# Patient Record
Sex: Female | Born: 1995 | Race: White | Hispanic: Yes | Marital: Single | Smoking: Never smoker
Health system: Southern US, Community
[De-identification: ages and names within clinical notes are randomized; demographics above are authoritative.]

## PROBLEM LIST (undated history)

## (undated) DIAGNOSIS — J189 Pneumonia, unspecified organism: Secondary | ICD-10-CM

## (undated) HISTORY — DX: Pneumonia, unspecified organism: J18.9

---
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
## 2017-01-25 ENCOUNTER — Ambulatory Visit (INDEPENDENT_AMBULATORY_CARE_PROVIDER_SITE_OTHER): Payer: BLUE CROSS/BLUE SHIELD

## 2017-01-25 DIAGNOSIS — Z113 Encounter for screening for infections with a predominantly sexual mode of transmission: Secondary | ICD-10-CM

## 2017-01-25 DIAGNOSIS — Z3042 Encounter for surveillance of injectable contraceptive: Secondary | ICD-10-CM

## 2017-01-25 MED ORDER — MEDROXYPROGESTERONE ACETATE 150 MG/ML IM SUSP
150.0000 mg | Freq: Once | INTRAMUSCULAR | Status: AC
  Administered 2017-01-25: 150 mg via INTRAMUSCULAR

## 2017-01-25 MED ORDER — MEDROXYPROGESTERONE ACETATE 150 MG/ML IM SUSP: 150.0000 mg | Freq: Once | INTRAMUSCULAR | Status: DC

## 2017-01-25 NOTE — Progress Notes (Signed)
Pt presents for depo injection. Pt within depo window, no urine hcg needed. Injection given, tolerated well. F/u depo injection visit scheduled. Also obtained urine specimen for yearly STI check.   Confidential number: [phone]

## 2017-01-26 LAB — C. TRACHOMATIS/N. GONORRHOEAE RNA
C. trachomatis RNA, TMA: NOT DETECTED
N. GONORRHOEAE RNA, TMA: NOT DETECTED

## 2017-03-17 IMAGING — CR DG FINGER THUMB 2+V*L*
3 series · 3 of 3 positions shown · non-contrast
Comparison: None.

CLINICAL DATA: 20-year-old female slammed left thumb in car door.
Distal pain and bruising. Initial encounter.

EXAM:
LEFT THUMB 2+V

[finger ap]
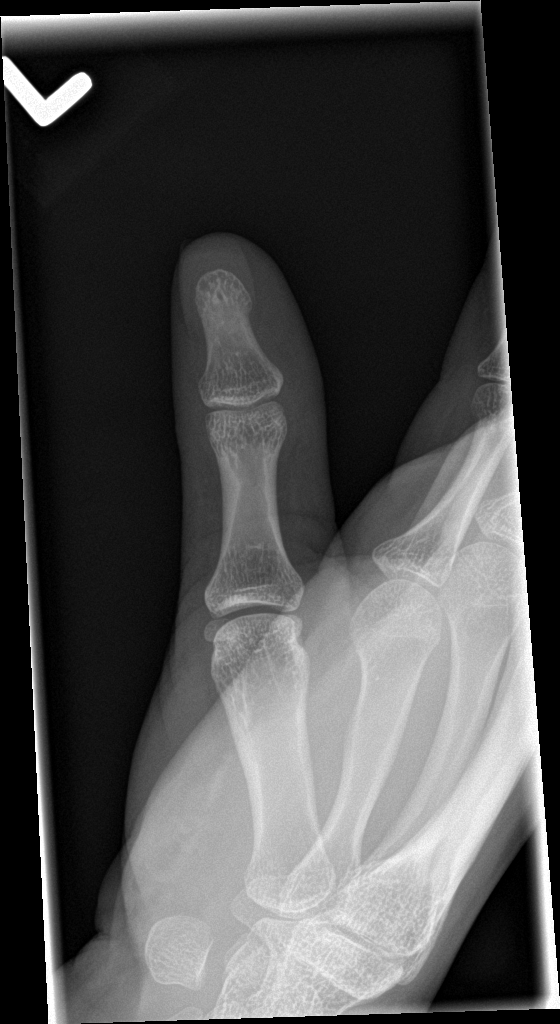

[finger obl]
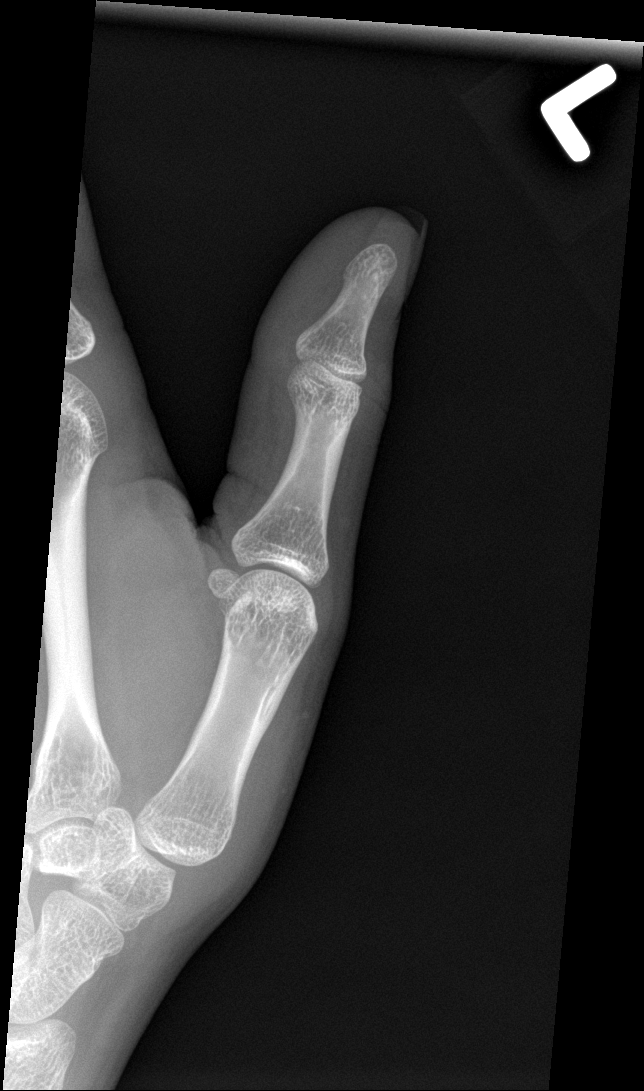

[finger lat]
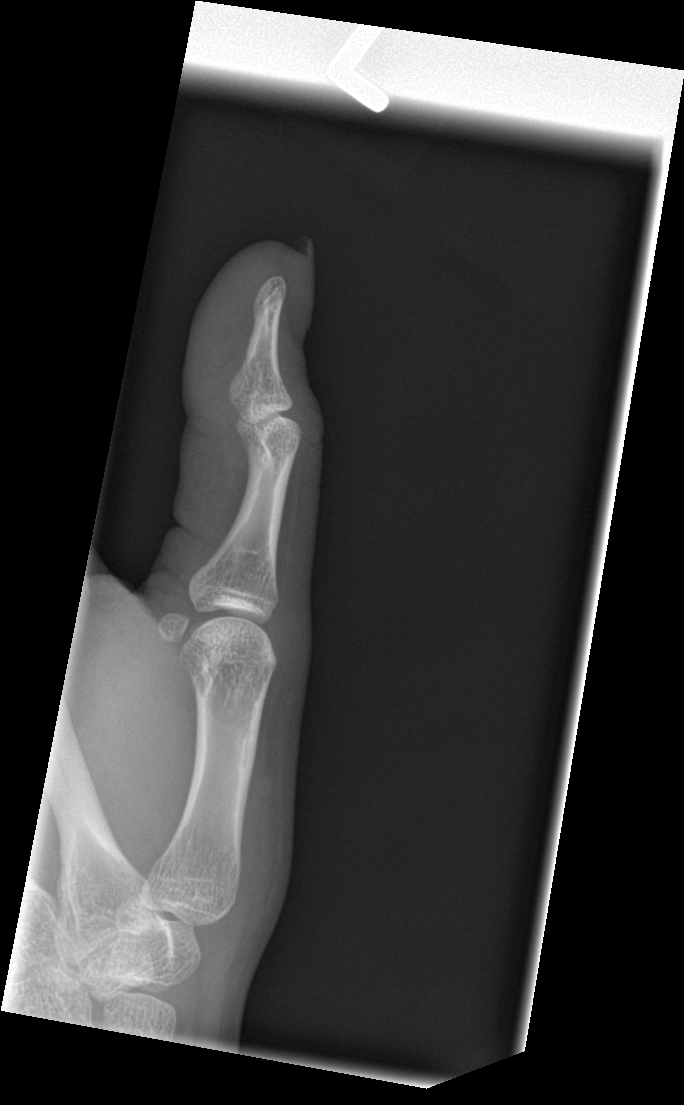

[3 of 3 positions shown; findings below may reference images not displayed]

FINDINGS: Bone mineralization is within normal limits. The visible left hand
appears skeletally mature. The left thumb distal phalanx is intact.
Joint spaces and alignment preserved. No acute osseous abnormality
identified. There does appear to be distal thumb soft tissue
swelling.
IMPRESSION: No acute fracture or dislocation identified about the left thumb.

## 2017-04-12 ENCOUNTER — Ambulatory Visit (INDEPENDENT_AMBULATORY_CARE_PROVIDER_SITE_OTHER): Payer: BLUE CROSS/BLUE SHIELD

## 2017-04-12 DIAGNOSIS — Z3042 Encounter for surveillance of injectable contraceptive: Secondary | ICD-10-CM

## 2017-04-12 MED ORDER — MEDROXYPROGESTERONE ACETATE 150 MG/ML IM SUSP
150.0000 mg | Freq: Once | INTRAMUSCULAR | Status: AC
Start: 2017-04-12 — End: 2017-04-12
  Administered 2017-04-12: 150 mg via INTRAMUSCULAR

## 2017-04-12 NOTE — Progress Notes (Signed)
Pt presents for depo injection. Pt within depo window, no urine hcg needed. Injection given, tolerated well. F/u depo injection visit scheduled.   

## 2017-04-14 ENCOUNTER — Encounter (HOSPITAL_COMMUNITY): Payer: Self-pay | Admitting: Emergency Medicine

## 2017-04-14 ENCOUNTER — Other Ambulatory Visit: Payer: Self-pay

## 2017-04-14 ENCOUNTER — Ambulatory Visit (HOSPITAL_COMMUNITY)
Admission: EM | Admit: 2017-04-14 | Discharge: 2017-04-14 | Disposition: A | Discharge status: Home / Self Care | Payer: Self-pay | Attending: Family Medicine | Admitting: Family Medicine

## 2017-04-14 DIAGNOSIS — M62838 Other muscle spasm: Secondary | ICD-10-CM

## 2017-04-14 DIAGNOSIS — S161XXA Strain of muscle, fascia and tendon at neck level, initial encounter: Secondary | ICD-10-CM

## 2017-04-14 MED ORDER — CYCLOBENZAPRINE HCL 10 MG PO TABS: 10.0000 mg | ORAL_TABLET | Freq: Three times a day (TID) | ORAL | 0 refills | Status: AC

## 2017-04-14 MED ORDER — DICLOFENAC SODIUM 75 MG PO TBEC: 75.0000 mg | DELAYED_RELEASE_TABLET | Freq: Two times a day (BID) | ORAL | 0 refills | Status: AC
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
## 2017-04-14 NOTE — ED Triage Notes (Signed)
Per pt she's here due to neck pain. Per pt it started on Sunday.../or

## 2017-05-02 ENCOUNTER — Ambulatory Visit (INDEPENDENT_AMBULATORY_CARE_PROVIDER_SITE_OTHER): Payer: BLUE CROSS/BLUE SHIELD

## 2017-05-02 ENCOUNTER — Ambulatory Visit (HOSPITAL_COMMUNITY)
Admission: EM | Admit: 2017-05-02 | Discharge: 2017-05-02 | Disposition: A | Discharge status: Home / Self Care | Payer: BLUE CROSS/BLUE SHIELD | Attending: Internal Medicine | Admitting: Internal Medicine

## 2017-05-02 ENCOUNTER — Encounter (HOSPITAL_COMMUNITY): Payer: Self-pay

## 2017-05-02 DIAGNOSIS — S62515A Nondisplaced fracture of proximal phalanx of left thumb, initial encounter for closed fracture: Secondary | ICD-10-CM

## 2017-05-02 NOTE — ED Triage Notes (Signed)
Pt presents today with left thumb pain that occurred last night when she fell on her hand. Has some visible bruising and swelling. Has not tried any OTC medication to help relieve sxs.

## 2017-05-02 NOTE — ED Provider Notes (Signed)
[facility]    CSN: [id number] Arrival date & time: 05/02/17  1413     History   Chief Complaint Chief Complaint  Patient presents with  . Hand Pain    HPI KENLEIGH TOBACK is a 21 y.o. female.   21 year old female, with no significant past medical history, presenting today complaining of left thumb pain.  Patient states that she was on a hover board last night and fell off.  Caught herself with her left hand.  Is unsure exactly how her hand was positioned when she fell.  Since that time, she has had pain, swelling and bruising in her left thumb.  Patient is right-handed. No other injuries or complaints   The history is provided by the patient.  Hand Pain  This is a new problem. The current episode started yesterday. The problem occurs constantly. The problem has not changed since onset.Pertinent negatives include no chest pain, no abdominal pain, no headaches and no shortness of breath. Exacerbated by: use of the left thumb  Nothing relieves the symptoms. She has tried nothing for the symptoms. The treatment provided no relief.    Past Medical History:  Diagnosis Date  . Pneumonia    Hospitalized in 2009    Patient Active Problem List   Diagnosis Date Noted  . Well adult on routine health check 12/17/2015  . Encounter for initial prescription of injectable contraceptive 05/16/2015  . Dysmenorrhea in adolescent 12/12/2014  . Headache 12/12/2014    History reviewed. No pertinent surgical history.  OB History    No data available       Home Medications    Prior to Admission medications   Medication Sig Start Date End Date Taking? Authorizing Provider  Estradiol Cypionate (DEPO-ESTRADIOL IM) Inject into the muscle.   Yes [provider]  cyclobenzaprine (FLEXERIL) 10 MG tablet Take 1 tablet (10 mg total) by mouth 3 (three) times daily. 04/14/17   Mardella Layman, MD  diclofenac (VOLTAREN) 75 MG EC tablet Take 1 tablet (75 mg total) by mouth 2 (two)  times daily. 04/14/17   Mardella Layman, MD  trimethoprim-polymyxin b Joaquim Lai) ophthalmic solution Place 2 drops in affected eye every 4 hours while awake until clear 07/20/16   Gregor Hams, NP    Family History No family history on file.  Social History Social History   Tobacco Use  . Smoking status: Never Smoker  . Smokeless tobacco: Never Used  Substance Use Topics  . Alcohol use: No    Alcohol/week: 0.0 oz  . Drug use: No     Allergies   Penicillins   Review of Systems Review of Systems  Constitutional: Negative for chills and fever.  HENT: Negative for congestion, dental problem, drooling, ear pain, facial swelling, mouth sores and sore throat.   Eyes: Negative for pain and visual disturbance.  Respiratory: Negative for cough and shortness of breath.   Cardiovascular: Negative for chest pain and palpitations.  Gastrointestinal: Negative for abdominal pain and vomiting.  Genitourinary: Negative for dysuria and hematuria.  Musculoskeletal: Positive for joint swelling (left thumb). Negative for arthralgias, back pain and neck pain.  Skin: Negative for color change and rash.  Neurological: Negative for seizures, syncope and headaches.  All other systems reviewed and are negative.    Physical Exam Triage Vital Signs ED Triage Vitals  Enc Vitals Group     BP 05/02/17 1432 120/81     Pulse Rate 05/02/17 1432 79     Resp 05/02/17 1432 16  Temp 05/02/17 1432 98.1 F (36.7 C)     Temp Source 05/02/17 1432 Oral     SpO2 05/02/17 1432 96 %     Weight --      Height --      Head Circumference --      Peak Flow --      Pain Score 05/02/17 1433 2     Pain Loc --      Pain Edu? --      Excl. in GC? --    No data found.  Updated Vital Signs BP 120/81 (BP Location: Right Arm)   Pulse 79   Temp 98.1 F (36.7 C) (Oral)   Resp 16   SpO2 96%   Visual Acuity Right Eye Distance:   Left Eye Distance:   Bilateral Distance:    Right Eye Near:   Left Eye  Near:    Bilateral Near:     Physical Exam  Constitutional: She is oriented to person, place, and time. She appears well-developed and well-nourished. No distress.  HENT:  Head: Normocephalic.  Eyes: EOM are normal. Pupils are equal, round, and reactive to light.  Neck: Normal range of motion.  Cardiovascular: Normal rate.  Pulmonary/Chest: Effort normal and breath sounds normal.  Abdominal: Soft.  Musculoskeletal:       Left hand: She exhibits decreased range of motion, tenderness and swelling.       Hands: Tenderness to palpation of the IP joint of the left thumb as well as the base of the left thumb.  There is a mild amount of soft tissue swelling and ecchymosis noted.  Decreased range of motion secondary to pain.  Good radial pulse and cap refill.  Neurovascularly intact  Neurological: She is alert and oriented to person, place, and time.  Skin: Skin is warm. She is not diaphoretic.  Psychiatric: She has a normal mood and affect.  Nursing note and vitals reviewed.    UC Treatments / Results  Labs (all labs ordered are listed, but only abnormal results are displayed) Labs Reviewed - No data to display  EKG  EKG Interpretation None       Radiology Dg Hand Complete Left  Result Date: 05/02/2017 CLINICAL DATA:  Fall, left hand injury EXAM: LEFT HAND - COMPLETE 3+ VIEW COMPARISON:  None. FINDINGS: There is a fracture at the base of the left thumb proximal phalanx. Fracture noted through the mid and proximal portions of the left thumb proximal phalanx. A small fragment at the left base is mildly displaced. No subluxation or dislocation. IMPRESSION: Left thumb proximal phalangeal fracture. Electronically Signed   By: Charlett NoseKevin  Dover M.D.   On: 05/02/2017 15:23    Procedures Procedures (including critical care time)  Medications Ordered in UC Medications - No data to display   Initial Impression / Assessment and Plan / UC Course  I have reviewed the triage vital signs and  the nursing notes.  Pertinent labs & imaging results that were available during my care of the patient were reviewed by me and considered in my medical decision making (see chart for details).     X-ray shows fracture of the proximal left thumb.  Patient was placed in a thumb spica - referred to ortho  Final Clinical Impressions(s) / UC Diagnoses   Final diagnoses:  Closed nondisplaced fracture of proximal phalanx of left thumb, initial encounter    ED Discharge Orders    None       Controlled Substance Prescriptions [place]  Controlled Substance Registry consulted? Not Applicable   Alecia LemmingBlue, Margrete Delude C, [place]PA-C 05/02/17 1542

## 2017-05-04 ENCOUNTER — Encounter (INDEPENDENT_AMBULATORY_CARE_PROVIDER_SITE_OTHER): Payer: Self-pay | Admitting: Orthopaedic Surgery

## 2017-05-04 ENCOUNTER — Ambulatory Visit (INDEPENDENT_AMBULATORY_CARE_PROVIDER_SITE_OTHER): Payer: BLUE CROSS/BLUE SHIELD | Admitting: Orthopaedic Surgery

## 2017-05-04 DIAGNOSIS — S62515A Nondisplaced fracture of proximal phalanx of left thumb, initial encounter for closed fracture: Secondary | ICD-10-CM | POA: Diagnosis not present

## 2017-05-04 NOTE — Progress Notes (Signed)
Office Visit Note   Patient: Paige PersiaJeni E Alvarado           Date of Birth: December 08, 1995           MRN: [id number] Visit Date: 05/04/2017              Requested by: Jonetta OsgoodBrown, Kirsten, MD [address] [place] [id number] PCP: Jonetta OsgoodBrown, Kirsten, MD   Assessment & Plan: Visit Diagnoses:  1. Closed nondisplaced fracture of proximal phalanx of left thumb, initial encounter     Plan: Impression is nondisplaced left thumb proximal phalanx fracture.  Thumb spica brace at all times.  Nonweightbearing and no use of the left hand at work for 6 weeks.  Follow-up in 4 weeks for recheck.  Will need 2 view x-rays of the left thumb on return.  Follow-Up Instructions: Return in about 4 weeks (around 06/01/2017).   Orders:  No orders of the defined types were placed in this encounter.  No orders of the defined types were placed in this encounter.     Procedures: No procedures performed   Clinical Data: No additional findings.   Subjective: Chief Complaint  Patient presents with  . Left Thumb - Pain    Patient is a 21 year old right-hand-dominant female who comes in with acute left thumb fracture from a mechanical fall 2 days ago.  She follows up today from the urgent care.  She denies any numbness and tingling.  She does endorse swelling and bruising.  She is taking over-the-counter meds with relief.    Review of Systems  Constitutional: Negative.   HENT: Negative.   Eyes: Negative.   Respiratory: Negative.   Cardiovascular: Negative.   Endocrine: Negative.   Musculoskeletal: Negative.   Neurological: Negative.   Hematological: Negative.   Psychiatric/Behavioral: Negative.   All other systems reviewed and are negative.    Objective: Vital Signs: There were no vitals taken for this visit.  Physical Exam  Constitutional: She is oriented to person, place, and time. She appears well-developed and well-nourished.  HENT:  Head: Normocephalic and atraumatic.    Eyes: EOM are normal.  Neck: Neck supple.  Pulmonary/Chest: Effort normal.  Abdominal: Soft.  Neurological: She is alert and oriented to person, place, and time.  Skin: Skin is warm. Capillary refill takes less than 2 seconds.  Psychiatric: She has a normal mood and affect. Her behavior is normal. Judgment and thought content normal.  Nursing note and vitals reviewed.   Ortho Exam Left thumb examined shows moderate swelling and bruising.  Neurovascular intact ligaments are intact.  Flexor extensor tendon function intact.  Thumb is warm and well-perfused.  Normal cap refill Specialty Comments:  No specialty comments available.  Imaging: No results found.   PMFS History: Patient Active Problem List   Diagnosis Date Noted  . Well adult on routine health check 12/17/2015  . Encounter for initial prescription of injectable contraceptive 05/16/2015  . Dysmenorrhea in adolescent 12/12/2014  . Headache 12/12/2014   Past Medical History:  Diagnosis Date  . Pneumonia    Hospitalized in 2009    History reviewed. No pertinent family history.  History reviewed. No pertinent surgical history. Social History   Occupational History  . Not on file  Tobacco Use  . Smoking status: Never Smoker  . Smokeless tobacco: Never Used  Substance and Sexual Activity  . Alcohol use: No    Alcohol/week: 0.0 oz  . Drug use: No  . Sexual activity: Yes  Birth control/protection: Injection

## 2017-06-04 ENCOUNTER — Ambulatory Visit (INDEPENDENT_AMBULATORY_CARE_PROVIDER_SITE_OTHER): Payer: BLUE CROSS/BLUE SHIELD | Admitting: Orthopaedic Surgery

## 2017-06-07 ENCOUNTER — Ambulatory Visit (INDEPENDENT_AMBULATORY_CARE_PROVIDER_SITE_OTHER): Payer: Self-pay | Admitting: Orthopaedic Surgery

## 2017-06-08 ENCOUNTER — Ambulatory Visit (INDEPENDENT_AMBULATORY_CARE_PROVIDER_SITE_OTHER): Payer: Self-pay | Admitting: Orthopaedic Surgery

## 2017-06-11 ENCOUNTER — Ambulatory Visit (INDEPENDENT_AMBULATORY_CARE_PROVIDER_SITE_OTHER): Payer: Self-pay | Admitting: Orthopaedic Surgery

## 2017-06-28 ENCOUNTER — Ambulatory Visit (INDEPENDENT_AMBULATORY_CARE_PROVIDER_SITE_OTHER): Payer: BLUE CROSS/BLUE SHIELD

## 2017-06-28 DIAGNOSIS — Z3042 Encounter for surveillance of injectable contraceptive: Secondary | ICD-10-CM | POA: Diagnosis not present

## 2017-06-28 MED ORDER — MEDROXYPROGESTERONE ACETATE 150 MG/ML IM SUSP
150.0000 mg | Freq: Once | INTRAMUSCULAR | Status: AC
  Administered 2017-06-28: 150 mg via INTRAMUSCULAR

## 2017-06-28 NOTE — Progress Notes (Signed)
Pt presents for depo injection. Pt within depo window, no urine hcg needed. Injection given, tolerated well. F/u depo injection visit scheduled.   

## 2017-09-13 ENCOUNTER — Ambulatory Visit (INDEPENDENT_AMBULATORY_CARE_PROVIDER_SITE_OTHER): Payer: BLUE CROSS/BLUE SHIELD

## 2017-09-13 DIAGNOSIS — Z3042 Encounter for surveillance of injectable contraceptive: Secondary | ICD-10-CM

## 2017-09-13 MED ORDER — MEDROXYPROGESTERONE ACETATE 150 MG/ML IM SUSP
150.0000 mg | Freq: Once | INTRAMUSCULAR | Status: AC
  Administered 2017-09-13: 150 mg via INTRAMUSCULAR

## 2017-09-13 NOTE — Progress Notes (Signed)
Pt presents for depo injection. Pt within depo window, no urine hcg needed. Injection given, tolerated well. F/u depo injection visit scheduled.   

## 2017-11-29 ENCOUNTER — Ambulatory Visit: Payer: BLUE CROSS/BLUE SHIELD

## 2017-12-06 ENCOUNTER — Ambulatory Visit (INDEPENDENT_AMBULATORY_CARE_PROVIDER_SITE_OTHER): Payer: Medicaid Other

## 2017-12-06 DIAGNOSIS — Z3049 Encounter for surveillance of other contraceptives: Secondary | ICD-10-CM

## 2017-12-06 DIAGNOSIS — IMO0001 Reserved for inherently not codable concepts without codable children: Secondary | ICD-10-CM

## 2017-12-06 MED ORDER — MEDROXYPROGESTERONE ACETATE 150 MG/ML IM SUSP
150.0000 mg | Freq: Once | INTRAMUSCULAR | Status: AC
  Administered 2017-12-06: 150 mg via INTRAMUSCULAR

## 2017-12-06 NOTE — Progress Notes (Signed)
Pt presents for depo injection. Pt within depo window, no urine hcg needed. Injection given, tolerated well. F/u depo injection visit scheduled.   

## 2018-02-21 ENCOUNTER — Ambulatory Visit (INDEPENDENT_AMBULATORY_CARE_PROVIDER_SITE_OTHER): Payer: Medicaid Other

## 2018-02-21 DIAGNOSIS — Z3049 Encounter for surveillance of other contraceptives: Secondary | ICD-10-CM

## 2018-02-21 DIAGNOSIS — Z3042 Encounter for surveillance of injectable contraceptive: Secondary | ICD-10-CM

## 2018-02-21 MED ORDER — MEDROXYPROGESTERONE ACETATE 150 MG/ML IM SUSP
150.0000 mg | Freq: Once | INTRAMUSCULAR | Status: AC
  Administered 2018-02-21: 150 mg via INTRAMUSCULAR

## 2018-02-21 NOTE — Progress Notes (Signed)
Pt presents for depo injection. Pt within depo window, no urine hcg needed. Injection given, tolerated well. F/u depo injection visit scheduled.   

## 2018-05-09 ENCOUNTER — Ambulatory Visit: Payer: Medicaid Other

## 2018-05-17 ENCOUNTER — Ambulatory Visit (INDEPENDENT_AMBULATORY_CARE_PROVIDER_SITE_OTHER): Payer: Medicaid Other

## 2018-05-17 DIAGNOSIS — Z3049 Encounter for surveillance of other contraceptives: Secondary | ICD-10-CM

## 2018-05-17 DIAGNOSIS — Z3042 Encounter for surveillance of injectable contraceptive: Secondary | ICD-10-CM

## 2018-05-17 MED ORDER — MEDROXYPROGESTERONE ACETATE 150 MG/ML IM SUSP
150.0000 mg | Freq: Once | INTRAMUSCULAR | Status: AC
  Administered 2018-05-17: 150 mg via INTRAMUSCULAR

## 2018-05-17 NOTE — Progress Notes (Signed)
Pt presents for depo injection. Pt within depo window, no urine hcg needed. Injection given, tolerated well. F/u depo injection visit scheduled.   

## 2018-08-03 ENCOUNTER — Ambulatory Visit: Payer: Medicaid Other

## 2018-08-18 ENCOUNTER — Ambulatory Visit: Payer: Medicaid Other | Admitting: Family

## 2018-12-24 DIAGNOSIS — Z3042 Encounter for surveillance of injectable contraceptive: Secondary | ICD-10-CM | POA: Diagnosis not present

## 2018-12-24 DIAGNOSIS — H9201 Otalgia, right ear: Secondary | ICD-10-CM | POA: Diagnosis not present

## 2019-06-21 ENCOUNTER — Encounter: Payer: Self-pay | Admitting: Family

## 2019-06-21 ENCOUNTER — Other Ambulatory Visit: Payer: Self-pay

## 2019-06-21 ENCOUNTER — Telehealth (INDEPENDENT_AMBULATORY_CARE_PROVIDER_SITE_OTHER): Payer: Medicaid Other | Admitting: Family

## 2019-06-21 DIAGNOSIS — Z30011 Encounter for initial prescription of contraceptive pills: Secondary | ICD-10-CM

## 2019-06-21 DIAGNOSIS — N946 Dysmenorrhea, unspecified: Secondary | ICD-10-CM

## 2019-06-21 MED ORDER — NORETHINDRONE ACET-ETHINYL EST 1.5-30 MG-MCG PO TABS: 1.0000 | ORAL_TABLET | Freq: Every day | ORAL | 11 refills | Status: DC

## 2019-06-21 NOTE — Progress Notes (Signed)
This note is not being shared with the patient for the following reason: To respect privacy (The patient or proxy has requested that the information not be shared).  THIS RECORD MAY CONTAIN CONFIDENTIAL INFORMATION THAT SHOULD NOT BE RELEASED WITHOUT REVIEW OF THE SERVICE PROVIDER.  Virtual Follow-Up Visit via Video Note  I connected with Paige Alvarado   on 06/21/19 at  2:30 PM EST by a video enabled telemedicine application and verified that I am speaking with the correct person using two identifiers.    This patient visit was completed through the use of an audio/video or telephone encounter in the setting of the State of Emergency due to the COVID-19 Pandemic.  I discussed that the purpose of this telehealth visit is to provide medical care while limiting exposure to the novel coronavirus.       I discussed the limitations of evaluation and management by telemedicine and the availability of in person appointments.    The patient expressed understanding and agreed to proceed.   The patient was physically located at home in [place] or a state in which I am permitted to provide care. The patient and/or parent/guardian understood that s/he may incur co-pays and cost sharing, and agreed to the telemedicine visit. The visit was reasonable and appropriate under the circumstances given the patient's presentation at the time.   The patient and/or parent/guardian has been advised of the potential risks and limitations of this mode of treatment (including, but not limited to, the absence of in-person examination) and has agreed to be treated using telemedicine. The patient's/patient's family's questions regarding telemedicine have been answered.    As this visit was completed in an ambulatory virtual setting, the patient and/or parent/guardian has also been advised to contact their provider's office for worsening conditions, and seek emergency medical treatment and/or call 911 if the patient deems  either necessary.    Paige Alvarado is a 24 y.o. female referred by Jonetta Osgood, MD here today for follow-up of contraception options.   History was provided by the patient.  PCP Confirmed?  yes  My Chart Activated?   yes    Plan from Last Visit:   Depo window for contraception   Chief Complaint: Desires change in contraception  -OCP initiation   History of Present Illness:  -has been on Depo for about 4 years -interested in OCPs; not interested in LARC at this time; has heard some things she is unsure of wanting something long term  -denies pregnancy intention; not currently sexually active -not active since being out of Depo window  -no daily medications  -LMP: before birth control periods were long and heavy; they were shorter and lighter  -has been having a period - spotting for about a 2 months with some cramping  -no abdominal pain, no pain with intercourse; no vaginal lesions, no discharge changes -specifically denies migraine with aura, cancers, known liver disease, DVT or PE PMH.   Review of Systems  Constitutional: Negative for chills, fever and malaise/fatigue.  HENT: Negative for sore throat.   Eyes: Negative for blurred vision.  Respiratory: Negative for shortness of breath.   Cardiovascular: Negative for chest pain and palpitations.  Gastrointestinal: Negative for abdominal pain.  Genitourinary: Negative for dysuria and frequency.  Musculoskeletal: Negative for myalgias.  Skin: Negative for rash.  Neurological: Negative for dizziness and headaches.  Psychiatric/Behavioral: The patient is not nervous/anxious.      Allergies  Allergen Reactions  . Penicillins Rash  RASH ALL OVER BODY AND HAIR STARTED TO FALL OUT    Outpatient Medications Prior to Visit  Medication Sig Dispense Refill  . cyclobenzaprine (FLEXERIL) 10 MG tablet Take 1 tablet (10 mg total) by mouth 3 (three) times daily. (Patient not taking: Reported on 05/04/2017) 20 tablet 0  .  diclofenac (VOLTAREN) 75 MG EC tablet Take 1 tablet (75 mg total) by mouth 2 (two) times daily. (Patient not taking: Reported on 05/04/2017) 14 tablet 0  . Estradiol Cypionate (DEPO-ESTRADIOL IM) Inject into the muscle.    . trimethoprim-polymyxin b (POLYTRIM) ophthalmic solution Place 2 drops in affected eye every 4 hours while awake until clear (Patient not taking: Reported on 12/06/2017) 10 mL 0   No facility-administered medications prior to visit.     Patient Active Problem List   Diagnosis Date Noted  . Well adult on routine health check 12/17/2015  . Encounter for initial prescription of injectable contraceptive 05/16/2015  . Dysmenorrhea in adolescent 12/12/2014  . Headache 12/12/2014    Past Medical History:  Reviewed and updated?  yes Past Medical History:  Diagnosis Date  . Pneumonia    Hospitalized in 2009    Visual Observations/Objective:   General Appearance: Well nourished well developed, in no apparent distress.  Eyes: conjunctiva no swelling or erythema ENT/Mouth: No hoarseness, No cough for duration of visit.  Neck: Supple  Respiratory: Respiratory effort normal, normal rate, no retractions or distress.   Cardio: Appears well-perfused, noncyanotic Musculoskeletal: no obvious deformity Skin: visible skin without rashes, ecchymosis, erythema Neuro: Awake and oriented X 3,  Psych:  normal affect, Insight and Judgment appropriate.    Assessment/Plan: 1. Dysmenorrhea in adolescent -should see improvement in symptoms with daily COC use    2. OCP (oral contraceptive pills) initiation -reviewed options including IUD, implant, pill, and Depo including efficacy, side effects, and procedures where applicable.  -shared decision making process to decide on start of Junel 1.5/30  -reviewed emergency contraception and missed doses -follow up in 8 weeks or sooner as needed   I discussed the assessment and treatment plan with the patient and/or parent/guardian.  They  were provided an opportunity to ask questions and all were answered.  They agreed with the plan and demonstrated an understanding of the instructions. They were advised to call back or seek an in-person evaluation in the emergency room if the symptoms worsen or if the condition fails to improve as anticipated.   Follow-up:   8 weeks video   Medical decision-making:   I spent 25 minutes on this telehealth visit inclusive of face-to-face video and care coordination time I was located remote in [place] during this encounter.   Parthenia Ames, NP    CC: Dillon Bjork, MD, Dillon Bjork, MD

## 2019-06-22 ENCOUNTER — Encounter: Payer: Self-pay | Admitting: Family

## 2019-08-16 ENCOUNTER — Telehealth: Payer: Medicaid Other | Admitting: Family

## 2019-08-16 ENCOUNTER — Encounter: Payer: Self-pay | Admitting: Family

## 2019-08-16 DIAGNOSIS — Z3041 Encounter for surveillance of contraceptive pills: Secondary | ICD-10-CM

## 2019-08-16 DIAGNOSIS — Z03818 Encounter for observation for suspected exposure to other biological agents ruled out: Secondary | ICD-10-CM | POA: Diagnosis not present

## 2019-08-16 DIAGNOSIS — Z20822 Contact with and (suspected) exposure to covid-19: Secondary | ICD-10-CM | POA: Diagnosis not present

## 2019-08-16 NOTE — Progress Notes (Deleted)
This note is not being shared with the patient for the following reason: To prevent harm (release of this note would result in harm to the life or physical safety of the patient or another).  THIS RECORD MAY CONTAIN CONFIDENTIAL INFORMATION THAT SHOULD NOT BE RELEASED WITHOUT REVIEW OF THE SERVICE PROVIDER.  Virtual Follow-Up Visit via Video Note  I connected with Paige Alvarado 's {family members:[id number]}  on 08/16/19 at  2:30 PM EDT by a video enabled telemedicine application and verified that I am speaking with the correct person using two identifiers.   Patient/parent location: ***   I discussed the limitations of evaluation and management by telemedicine and the availability of in person appointments.  I discussed that the purpose of this telehealth visit is to provide medical care while limiting exposure to the novel coronavirus.  The {family members:20773} expressed understanding and agreed to proceed.   Paige Alvarado is a 24 y.o. female referred by Dillon Bjork, MD here today for follow-up of ***.  Previsit planning completed:  {YES/NO/NOT APPLICABLE:20182}   History was provided by the {CHL AMB PERSONS; PED RELATIVES/OTHER W/PATIENT:[phone]}.  Plan from Last Visit:   Video visit 06/21/19 - OCP initiation (Norethindrone Acetate-Ethinyl Estradiol 1.5-30 MG-MCG tablet) after being on depo for 4 years for dysmenorrhea  Chief Complaint: dysmenorrhea  History of Present Illness:  ***    ROS:  ***  Allergies  Allergen Reactions  . Penicillins Rash    RASH ALL OVER BODY AND HAIR STARTED TO FALL OUT    Outpatient Medications Prior to Visit  Medication Sig Dispense Refill  . cyclobenzaprine (FLEXERIL) 10 MG tablet Take 1 tablet (10 mg total) by mouth 3 (three) times daily. (Patient not taking: Reported on 05/04/2017) 20 tablet 0  . diclofenac (VOLTAREN) 75 MG EC tablet Take 1 tablet (75 mg total) by mouth 2 (two) times daily. (Patient not taking: Reported on 05/04/2017) 14  tablet 0  . Estradiol Cypionate (DEPO-ESTRADIOL IM) Inject into the muscle.    . Norethindrone Acetate-Ethinyl Estradiol (JUNEL 1.5/30) 1.5-30 MG-MCG tablet Take 1 tablet by mouth daily. 1 Package 11  . trimethoprim-polymyxin b (POLYTRIM) ophthalmic solution Place 2 drops in affected eye every 4 hours while awake until clear (Patient not taking: Reported on 12/06/2017) 10 mL 0   No facility-administered medications prior to visit.     Patient Active Problem List   Diagnosis Date Noted  . Well adult on routine health check 12/17/2015  . Encounter for initial prescription of injectable contraceptive 05/16/2015  . Dysmenorrhea in adolescent 12/12/2014  . Headache 12/12/2014    Social History: Lives with:  {Persons; PED relatives w/patient:19415} and describes home situation as *** School: In Grade *** at [employer] Future Plans:  {CHL AMB PED FUTURE OIZTI:[id number]} Exercise:  {Exercise:23478} Sports:  {Misc; sports:10024} Sleep:  {SX; SLEEP PATTERNS:18802}  Confidentiality was discussed with the patient and if applicable, with caregiver as well.  Patient's personal or confidential phone number: *** Enter confidential phone number in Family Comments section of [employer] Tobacco?  {YES/NO/WILD SNKNL:97673} Drugs/ETOH?  {YES/NO/WILD ALPFX:90240} Partner preference?  {CHL AMB PARTNER [phone]} Sexually Active?  {YES/NO/WILD XBDZH:29924}  Pregnancy Prevention:  {Pregnancy Prevention:[phone]}, reviewed condoms & plan B Trauma currently or in the pastt?  {YES/NO/WILD QASTM:19622} Suicidal or Self-Harm thoughts?   {YES/NO/WILD WLNLG:92119} Guns in the home?  {YES/NO/WILD ERDEY:81448}  {Common ambulatory SmartLinks:19316}  Visual Observations/Objective:  *** General Appearance: Well nourished well developed, in no apparent distress.  Eyes: conjunctiva no swelling or erythema  ENT/Mouth: No hoarseness, No cough for duration of visit.  Neck: Supple  Respiratory:  Respiratory effort normal, normal rate, no retractions or distress.   Cardio: Appears well-perfused, noncyanotic Musculoskeletal: no obvious deformity Skin: visible skin without rashes, ecchymosis, erythema Neuro: Awake and oriented X 3,  Psych:  normal affect, Insight and Judgment appropriate.    Assessment/Plan: There are no diagnoses linked to this encounter.   I discussed the assessment and treatment plan with the patient and/or parent/guardian.  They were provided an opportunity to ask questions and all were answered.  They agreed with the plan and demonstrated an understanding of the instructions. They were advised to call back or seek an in-person evaluation in the emergency room if the symptoms worsen or if the condition fails to improve as anticipated.   Follow-up:   ***  Medical decision-making:   I spent *** minutes on this telehealth visit inclusive of face-to-face video and care coordination time I was located *** during this encounter.   Cherlynn June, MD    CC: Jonetta Osgood, MD, Jonetta Osgood, MD

## 2019-08-16 NOTE — Progress Notes (Signed)
Technical issues. Patient needs to be rescheduled.

## 2019-10-05 ENCOUNTER — Encounter: Payer: Self-pay | Admitting: Pediatrics

## 2020-04-05 ENCOUNTER — Other Ambulatory Visit: Payer: Self-pay | Admitting: Family
